# Patient Record
Sex: Male | Born: 1970 | Race: Black or African American | Hispanic: No | Marital: Single | Smoking: Former smoker
Health system: Southern US, Community
[De-identification: ages and names within clinical notes are randomized; demographics above are authoritative.]

---

## 2019-11-18 ENCOUNTER — Emergency Department (HOSPITAL_BASED_OUTPATIENT_CLINIC_OR_DEPARTMENT_OTHER)
Admission: EM | Admit: 2019-11-18 | Discharge: 2019-11-18 | Disposition: A | Discharge status: Home / Self Care | Payer: 59 | Attending: Emergency Medicine | Admitting: Emergency Medicine

## 2019-11-18 ENCOUNTER — Encounter (HOSPITAL_BASED_OUTPATIENT_CLINIC_OR_DEPARTMENT_OTHER): Payer: Self-pay | Admitting: Emergency Medicine

## 2019-11-18 ENCOUNTER — Other Ambulatory Visit: Payer: Self-pay

## 2019-11-18 ENCOUNTER — Emergency Department (HOSPITAL_BASED_OUTPATIENT_CLINIC_OR_DEPARTMENT_OTHER): Payer: 59

## 2019-11-18 DIAGNOSIS — K769 Liver disease, unspecified: Secondary | ICD-10-CM | POA: Diagnosis not present

## 2019-11-18 DIAGNOSIS — N132 Hydronephrosis with renal and ureteral calculous obstruction: Secondary | ICD-10-CM | POA: Diagnosis not present

## 2019-11-18 DIAGNOSIS — R109 Unspecified abdominal pain: Secondary | ICD-10-CM | POA: Diagnosis present

## 2019-11-18 DIAGNOSIS — R319 Hematuria, unspecified: Secondary | ICD-10-CM | POA: Insufficient documentation

## 2019-11-18 DIAGNOSIS — N289 Disorder of kidney and ureter, unspecified: Secondary | ICD-10-CM | POA: Insufficient documentation

## 2019-11-18 DIAGNOSIS — Z87891 Personal history of nicotine dependence: Secondary | ICD-10-CM | POA: Diagnosis not present

## 2019-11-18 LAB — CBC WITH DIFFERENTIAL/PLATELET
Abs Immature Granulocytes: 0.02 10*3/uL (ref 0.00–0.07)
Basophils Absolute: 0 10*3/uL (ref 0.0–0.1)
Basophils Relative: 0 %
Eosinophils Absolute: 0.1 10*3/uL (ref 0.0–0.5)
Eosinophils Relative: 1 %
HCT: 40.7 % (ref 39.0–52.0)
Hemoglobin: 13.3 g/dL (ref 13.0–17.0)
Immature Granulocytes: 0 %
Lymphocytes Relative: 15 %
Lymphs Abs: 1.5 10*3/uL (ref 0.7–4.0)
MCH: 28.7 pg (ref 26.0–34.0)
MCHC: 32.7 g/dL (ref 30.0–36.0)
MCV: 87.7 fL (ref 80.0–100.0)
Monocytes Absolute: 0.8 10*3/uL (ref 0.1–1.0)
Monocytes Relative: 8 %
Neutro Abs: 7.5 10*3/uL (ref 1.7–7.7)
Neutrophils Relative %: 76 %
Platelets: 238 10*3/uL (ref 150–400)
RBC: 4.64 MIL/uL (ref 4.22–5.81)
RDW: 13.7 % (ref 11.5–15.5)
WBC: 9.9 10*3/uL (ref 4.0–10.5)
nRBC: 0 % (ref 0.0–0.2)

## 2019-11-18 LAB — URINALYSIS, ROUTINE W REFLEX MICROSCOPIC
Bilirubin Urine: NEGATIVE
Glucose, UA: NEGATIVE mg/dL
Ketones, ur: NEGATIVE mg/dL
Leukocytes,Ua: NEGATIVE
Nitrite: NEGATIVE
Protein, ur: NEGATIVE mg/dL
Specific Gravity, Urine: 1.02 (ref 1.005–1.030)
pH: 6 (ref 5.0–8.0)

## 2019-11-18 LAB — URINALYSIS, MICROSCOPIC (REFLEX): RBC / HPF: >50 RBC/hpf (ref 0–5)

## 2019-11-18 LAB — BASIC METABOLIC PANEL
Anion gap: 9 (ref 5–15)
BUN: 14 mg/dL (ref 6–20)
CO2: 27 mmol/L (ref 22–32)
Calcium: 9.7 mg/dL (ref 8.9–10.3)
Chloride: 105 mmol/L (ref 98–111)
Creatinine, Ser: 1.01 mg/dL (ref 0.61–1.24)
GFR calc Af Amer: >60 mL/min (ref >60)
GFR calc non Af Amer: >60 mL/min (ref >60)
Glucose, Bld: 105 mg/dL — ABNORMAL HIGH (ref 70–99)
Potassium: 3.5 mmol/L (ref 3.5–5.1)
Sodium: 141 mmol/L (ref 135–145)

## 2019-11-18 MED ORDER — TAMSULOSIN HCL 0.4 MG PO CAPS
0.4000 mg | ORAL_CAPSULE | Freq: Once | ORAL | Status: AC
  Administered 2019-11-18: 0.4 mg via ORAL
  Filled 2019-11-18: qty 1

## 2019-11-18 MED ORDER — OXYCODONE HCL 5 MG PO TABS: 5.0000 mg | ORAL_TABLET | Freq: Four times a day (QID) | ORAL | 0 refills | Status: AC | PRN

## 2019-11-18 MED ORDER — OXYCODONE-ACETAMINOPHEN 5-325 MG PO TABS
1.0000 | ORAL_TABLET | Freq: Once | ORAL | Status: DC
  Filled 2019-11-18: qty 1

## 2019-11-18 MED ORDER — TAMSULOSIN HCL 0.4 MG PO CAPS: 0.4000 mg | ORAL_CAPSULE | Freq: Every day | ORAL | 0 refills | Status: AC

## 2019-11-18 MED ORDER — KETOROLAC TROMETHAMINE 30 MG/ML IJ SOLN
30.0000 mg | Freq: Once | INTRAMUSCULAR | Status: AC
  Administered 2019-11-18: 30 mg via INTRAVENOUS
  Filled 2019-11-18: qty 1

## 2019-11-18 NOTE — ED Provider Notes (Signed)
[facility] EMERGENCY DEPARTMENT Provider Note   CSN: [id number] Arrival date & time: 11/18/19  1015     History Chief Complaint  Patient presents with  . Flank Pain    Shawn Saunders is a 49 y.o. male presents to the ED for evaluation of flank pain.  Onset this morning.  States initially mild and gradual but became unbearable.  It has been intermittent since.  Actually has no pain currently.  Feels like the pain has resolved since arrival to the ED.  Associated with blood in his urine since Thursday.  No other associate symptoms including fever, nausea, vomiting, dysuria.  Urine output normal.  Bowel movements have been slightly looser than usual.  No abdominal or testicular pain.  No history of kidney stones.  No associated chest pain, distal extremity pain loss of sensation or weakness.  No interventions.  No changes with movement, walking or position changes. HPI     History reviewed. No pertinent past medical history.  There are no problems to display for this patient.   History reviewed. No pertinent surgical history.     No family history on file.  Social History   Tobacco Use  . Smoking status: Former Games developer  . Smokeless tobacco: Never Used  Vaping Use  . Vaping Use: Never used  Substance Use Topics  . Alcohol use: Yes    Comment: occ  . Drug use: Yes    Types: Marijuana    Home Medications Prior to Admission medications   Medication Sig Start Date End Date Taking? Authorizing Provider  oxyCODONE (OXY IR/ROXICODONE) 5 MG immediate release tablet Take 1 tablet (5 mg total) by mouth every 6 (six) hours as needed for up to 3 days for severe pain. 11/18/19 11/21/19  Liberty Handy, PA-C  tamsulosin (FLOMAX) 0.4 MG CAPS capsule Take 1 capsule (0.4 mg total) by mouth daily for 10 days. 11/18/19 11/28/19  Liberty Handy, PA-C    Allergies    Patient has no known allergies.  Review of Systems   Review of Systems  Genitourinary: Positive for flank  pain and hematuria.  All other systems reviewed and are negative.   Physical Exam Updated Vital Signs BP (!) 131/98 (BP Location: Left Arm)   Pulse 67   Temp 98.4 F (36.9 C) (Oral)   Resp 14   Ht 5\' 9"  (1.753 m)   Wt 90.7 kg   SpO2 100%   BMI 29.53 kg/m   Physical Exam Vitals and nursing note reviewed.  Constitutional:      Appearance: He is well-developed.     Comments: Non toxic.  HENT:     Head: Normocephalic and atraumatic.     Nose: Nose normal.  Eyes:     Conjunctiva/sclera: Conjunctivae normal.  Cardiovascular:     Rate and Rhythm: Normal rate and regular rhythm.     Heart sounds: Normal heart sounds.  Pulmonary:     Effort: Pulmonary effort is normal.     Breath sounds: Normal breath sounds.  Abdominal:     General: Bowel sounds are normal.     Palpations: Abdomen is soft.     Tenderness: There is no abdominal tenderness.     Comments: No G/R/R. No suprapubic or CVA tenderness. Negative Murphy's and McBurney's  Musculoskeletal:        General: Normal range of motion.     Cervical back: Normal range of motion.     Comments: Patient stood up from bed, bend over to  check if flank was still hurting. He had no pain.   Skin:    General: Skin is warm and dry.     Capillary Refill: Capillary refill takes less than 2 seconds.  Neurological:     Mental Status: He is alert.  Psychiatric:        Behavior: Behavior normal.     ED Results / Procedures / Treatments   Labs (all labs ordered are listed, but only abnormal results are displayed) Labs Reviewed  URINALYSIS, ROUTINE W REFLEX MICROSCOPIC - Abnormal; Notable for the following components:      Result Value   Color, Urine AMBER (*)    APPearance CLOUDY (*)    Hgb urine dipstick LARGE (*)    All other components within normal limits  BASIC METABOLIC PANEL - Abnormal; Notable for the following components:   Glucose, Bld 105 (*)    All other components within normal limits  URINALYSIS, MICROSCOPIC  (REFLEX) - Abnormal; Notable for the following components:   Bacteria, UA RARE (*)    All other components within normal limits  CBC WITH DIFFERENTIAL/PLATELET    EKG None  Radiology CT Renal Stone Study  Result Date: 11/18/2019 CLINICAL DATA:  Right flank pain and hematuria. EXAM: CT ABDOMEN AND PELVIS WITHOUT CONTRAST TECHNIQUE: Multidetector CT imaging of the abdomen and pelvis was performed following the standard protocol without IV contrast. COMPARISON:  None. FINDINGS: Lower chest: No acute abnormality. Hepatobiliary: Multiple small low-density lesions are identified in the liver. Evaluation is limited without contrast. They may represent cysts. The gallbladder and biliary tree are normal. Pancreas: Unremarkable. No pancreatic ductal dilatation or surrounding inflammatory changes. Spleen: Normal in size without focal abnormality. Adrenals/Urinary Tract: There is right hydronephrosis due to obstruction by 3 mm stone in the distal right ureter. There is a 9 mm hyperdense lesion in the lateral midpole right kidney, nonspecific. Low-density cysts are identified in the right kidney. The left kidney is normal. The bilateral adrenal glands are normal. There is partial decompressed bladder with mild diffuse thickening of the bladder. Stomach/Bowel: Stomach is within normal limits. Appendix appears normal. No evidence of bowel wall thickening, distention, or inflammatory changes. Vascular/Lymphatic: Aortic atherosclerosis. No enlarged abdominal or pelvic lymph nodes. Reproductive: Prostate is unremarkable. Other: None. Musculoskeletal: No acute or significant osseous findings. IMPRESSION: 1. Right hydronephrosis due to obstruction by 3 mm stone in the distal right ureter. 2. 9 mm hyperdense lesion in the lateral midpole right kidney, nonspecific. Recommend further evaluation with renal ultrasound on outpatient basis. 3. Multiple small low-density lesions are identified in the liver. Evaluation is limited  without contrast. They may represent cysts. 4. Aortic atherosclerosis. Aortic Atherosclerosis (ICD10-I70.0). Electronically Signed   By: Sherian Rein M.D.   On: 11/18/2019 11:47    Procedures Procedures (including critical care time)  Medications Ordered in ED Medications  oxyCODONE-acetaminophen (PERCOCET/ROXICET) 5-325 MG per tablet 1 tablet (1 tablet Oral Refused 11/18/19 1215)  tamsulosin (FLOMAX) capsule 0.4 mg (0.4 mg Oral Given 11/18/19 1211)  ketorolac (TORADOL) 30 MG/ML injection 30 mg (30 mg Intravenous Given 11/18/19 1209)    ED Course  I have reviewed the triage vital signs and the nursing notes.  Pertinent labs & imaging results that were available during my care of the patient were reviewed by me and considered in my medical decision making (see chart for details).  Clinical Course as of Nov 18 1314  Mon Nov 18, 2019  1142 Bacteria, UA(!): RARE [CG]  1142 RBC / HPF: >50 [  CG]  1142 Mucus: PRESENT [CG]  1203 Creatinine: 1.01 [CG]  1203 IMPRESSION: 1. Right hydronephrosis due to obstruction by 3 mm stone in the distal right ureter. 2. 9 mm hyperdense lesion in the lateral midpole right kidney, nonspecific. Recommend further evaluation with renal ultrasound on outpatient basis. 3. Multiple small low-density lesions are identified in the liver. Evaluation is limited without contrast. They may represent cysts. 4. Aortic atherosclerosis.  CT Renal Soundra Pilon [CG]    Clinical Course User Index [CG] Jerrell Mylar   MDM Rules/Calculators/A&P                          49 year old male presents for acute right flank pain with hematuria.  Exam is benign, his pain is actually resolved since arrival to the ED.  No neuro pulse deficits.  No associated chest pain.  No abdominal tenderness.  High suspicion for ureteral or kidney stone.  No fever or dysuria to suggest infection.  I have ordered labs, urinalysis, CT renal.  I have ordered Percocet, Flomax,  Toradol.  Your work-up personally visualized and interpreted.  He has more than 50 RBCs and rare bacteria, no other findings to suggest an infection in the urine.  Creatinine is normal.  No leukocytosis.  CT shows 3 mm distal ureteral stone with mild right hydronephrosis.  Several liver lesions and 1 lesion in the right kidney also noted, recommending ultrasound.  I reevaluated patient and he has no return of pain.  Urinating without difficulty.  I have discussed his CT findings including ureteral stone and lesions in the liver and kidney.  Will discharge with pain control, tamsulosin.  He was recommended PCP follow-up for follow-up on these lesions noted on scan.  He understands and agrees.  Return precautions discussed. Final Clinical Impression(s) / ED Diagnoses Final diagnoses:  Ureteral stone with hydronephrosis  Lesion of liver  Lesion of right native kidney    Rx / DC Orders ED Discharge Orders         Ordered    tamsulosin (FLOMAX) 0.4 MG CAPS capsule  Daily     Discontinue  Reprint     11/18/19 1304    oxyCODONE (OXY IR/ROXICODONE) 5 MG immediate release tablet  Every 6 hours PRN     Discontinue  Reprint     11/18/19 1304           Liberty Handy, PA-C 11/18/19 1316    Benjiman Core, MD 11/18/19 1523

## 2019-11-18 NOTE — Discharge Instructions (Signed)
You were seen in the ED for flank pain  CT confirms 3 mm stone at the end of your right ureter, almost air bladder.  This is the cause of your pain.  Increased oral fluids.  Take ibuprofen or acetaminophen as needed for any mild to moderate pain.  Take oxycodone for any breakthrough or severe pain.  Take tamsulosin capsule once daily for the next 7 to 10 days to help dilate your ureter and help you pass the stone.  Formal discussion of kidney stones and long-term management is with urology.  I have given you a urology contact information for follow-up, as needed.  Call them and make an appointment if you have lingering pain more than 7 to 10 days.  CT scan found lesions on your liver and on your right kidney.  The exact nature of these lesions is unclear.  Make an appointment with a primary care doctor.  We recommend ultrasounds to confirm what type of lesions these are.  Return to the ED for fever, nausea or vomiting, worsening pain, difficulty urinating

## 2019-11-18 NOTE — ED Triage Notes (Signed)
Pt reports RT flank pain, started this morning; describes as unbearable

## 2021-10-15 IMAGING — CT CT RENAL STONE PROTOCOL
2 of 4 series · 16 of 46 positions shown, 18 images · non-contrast
Comparison: None.

CLINICAL DATA: Right flank pain and hematuria.

EXAM:
CT ABDOMEN AND PELVIS WITHOUT CONTRAST
TECHNIQUE: Multidetector CT imaging of the abdomen and pelvis was performed
following the standard protocol without IV contrast.

[Series 2: axial st · axial · 0.83mm/px · z∈[-717,-202]mm · 13 of 113 slices shown, 15 images]
[im 5/113  soft-tissue]
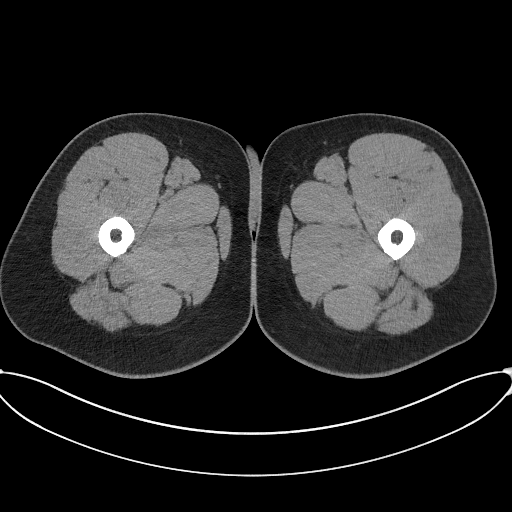
[im 5/113  bone]
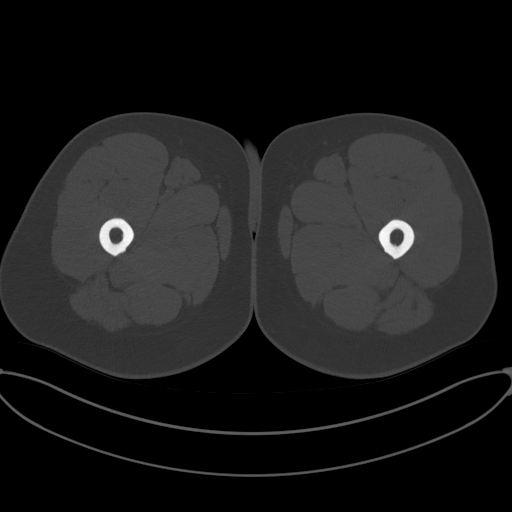
[im 14/113  soft-tissue]
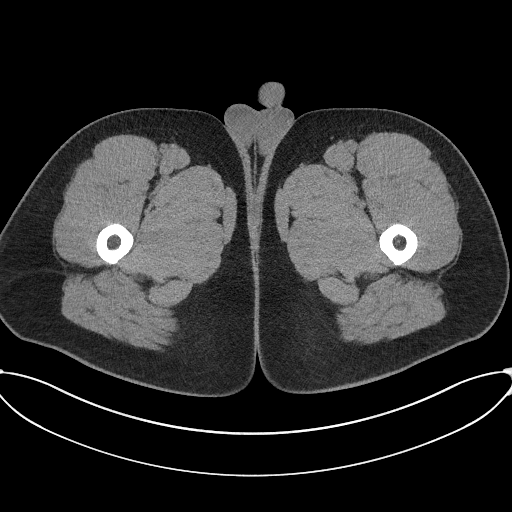
[im 23/113  soft-tissue]
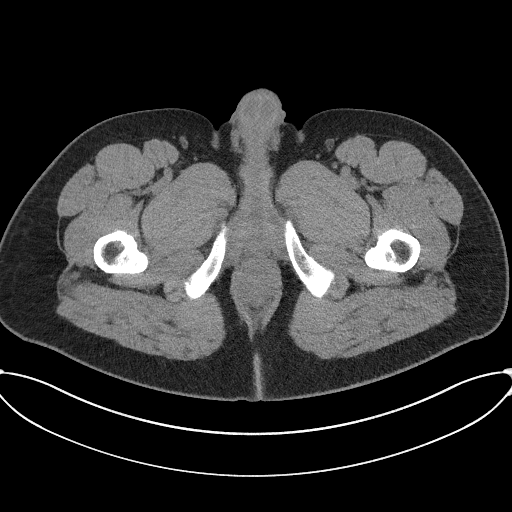
[im 32/113  soft-tissue]
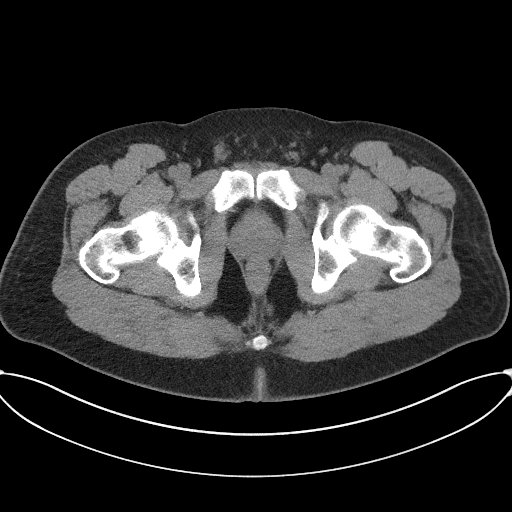
[im 41/113  soft-tissue]
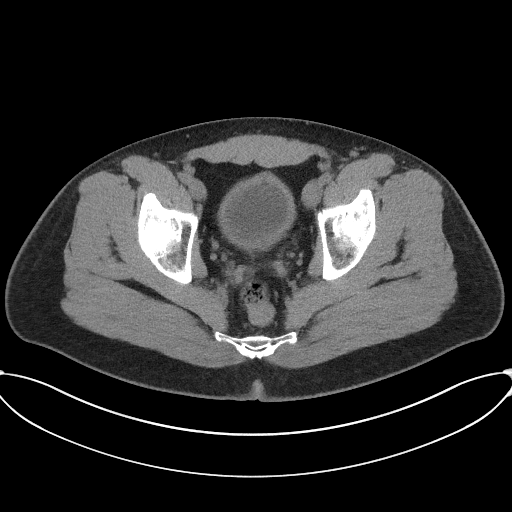
[im 50/113  soft-tissue]
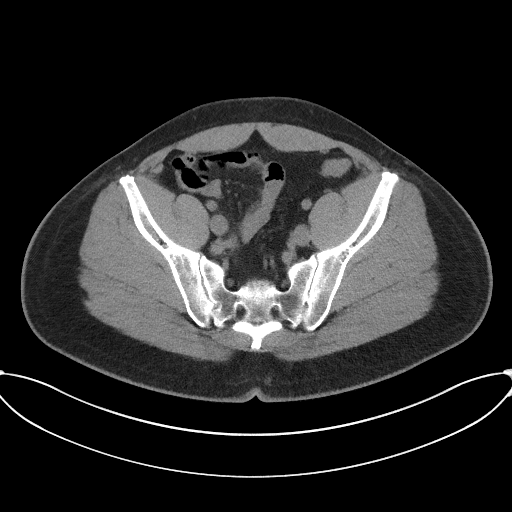
[im 59/113  soft-tissue]
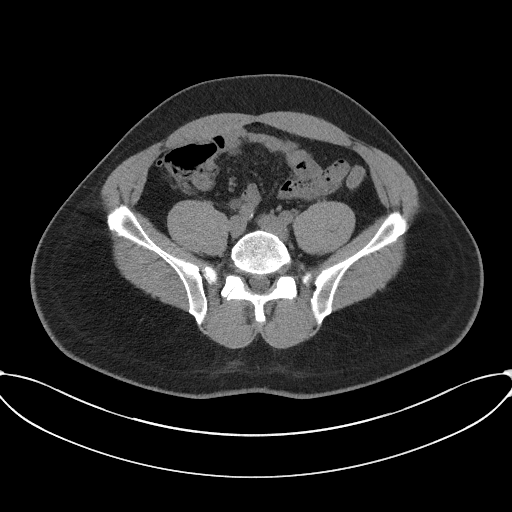
[im 63/113  soft-tissue]
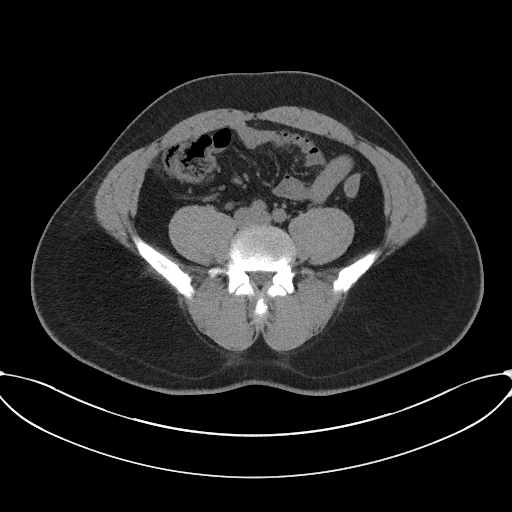
[im 72/113  soft-tissue]
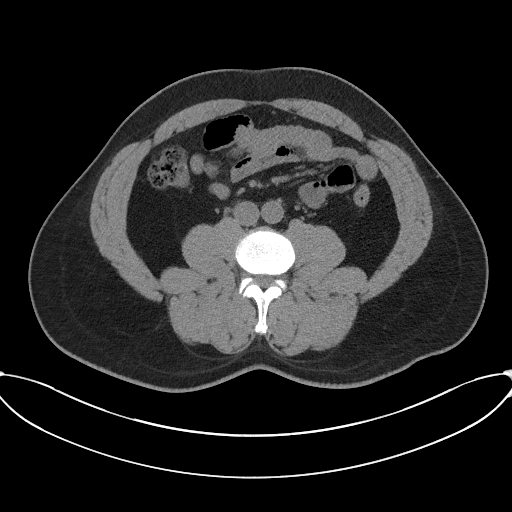
[im 72/113  bone]
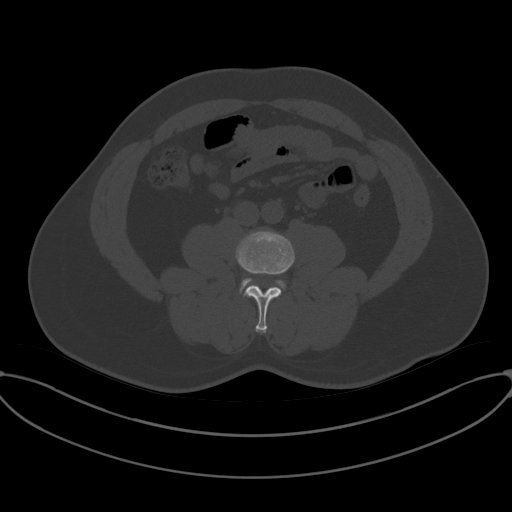
[im 81/113  soft-tissue]
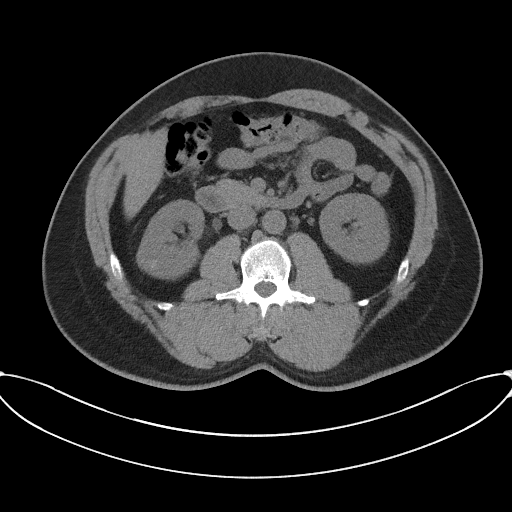
[im 90/113  soft-tissue]
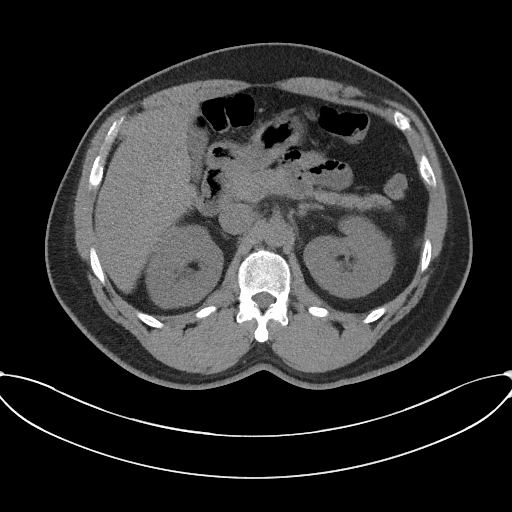
[im 99/113  soft-tissue]
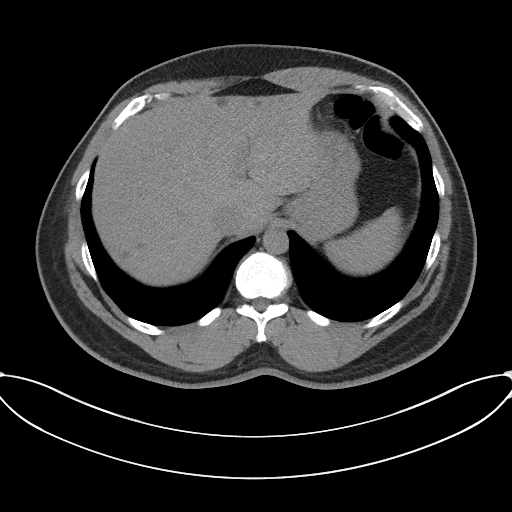
[im 108/113  soft-tissue]
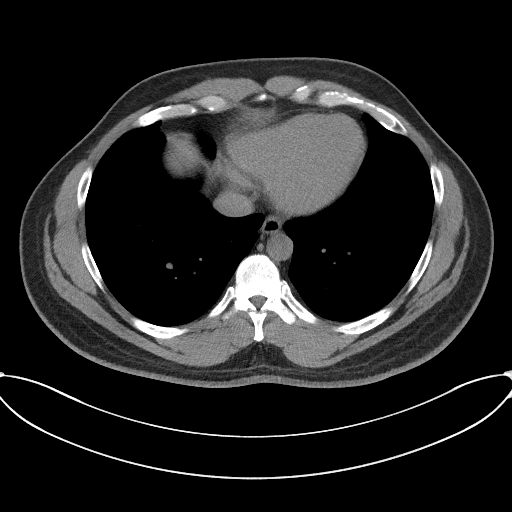

[Series 4: coronal st · coronal · 0.94mm/px · 3 of 118 slices shown]
[im 40/118  soft-tissue]
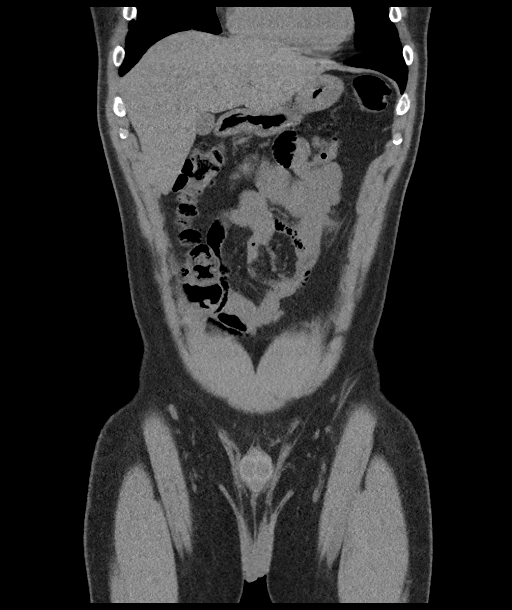
[im 53/118  soft-tissue]
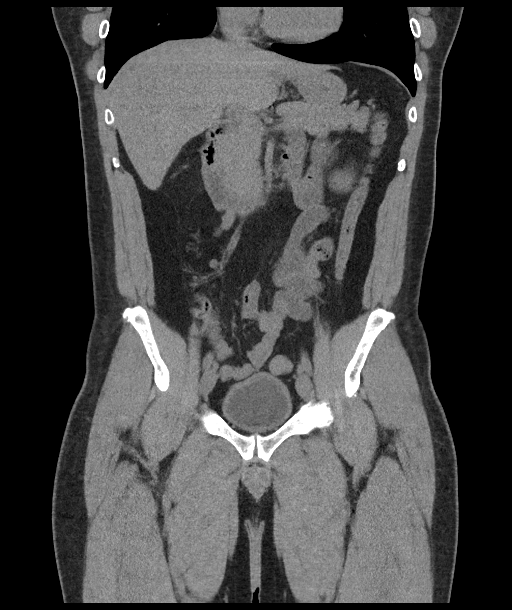
[im 66/118  soft-tissue]
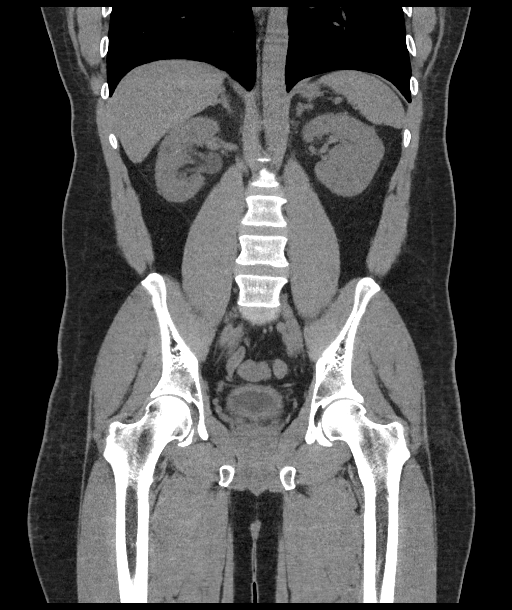

[16 of 46 positions shown; findings below may reference images not displayed]

FINDINGS: Lower chest: No acute abnormality.

Hepatobiliary: Multiple small low-density lesions are identified in
the liver. Evaluation is limited without contrast. They may
represent cysts. The gallbladder and biliary tree are normal.

Pancreas: Unremarkable. No pancreatic ductal dilatation or
surrounding inflammatory changes.

Spleen: Normal in size without focal abnormality.

Adrenals/Urinary Tract: There is right hydronephrosis due to
obstruction by 3 mm stone in the distal right ureter. There is a 9
mm hyperdense lesion in the lateral midpole right kidney,
nonspecific. Low-density cysts are identified in the right kidney.
The left kidney is normal. The bilateral adrenal glands are normal.
There is partial decompressed bladder with mild diffuse thickening
of the bladder.

Stomach/Bowel: Stomach is within normal limits. Appendix appears
normal. No evidence of bowel wall thickening, distention, or
inflammatory changes.

Vascular/Lymphatic: Aortic atherosclerosis. No enlarged abdominal or
pelvic lymph nodes.

Reproductive: Prostate is unremarkable.

Other: None.

Musculoskeletal: No acute or significant osseous findings.
IMPRESSION: 1. Right hydronephrosis due to obstruction by 3 mm stone in the
distal right ureter.
2. 9 mm hyperdense lesion in the lateral midpole right kidney,
nonspecific. Recommend further evaluation with renal ultrasound on
outpatient basis.
3. Multiple small low-density lesions are identified in the liver.
Evaluation is limited without contrast. They may represent cysts.
4. Aortic atherosclerosis.

Aortic Atherosclerosis ([id number]).
